# Patient Record
Sex: Male | Born: 1990 | Race: White | Hispanic: No | Marital: Married | Smoking: Never smoker
Health system: Southern US, Community
[De-identification: ages and names within clinical notes are randomized; demographics above are authoritative.]

---
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
## 2011-08-04 ENCOUNTER — Emergency Department (HOSPITAL_COMMUNITY)
Admission: EM | Admit: 2011-08-04 | Discharge: 2011-08-04 | Disposition: A | Discharge status: Home / Self Care | Payer: BC Managed Care – PPO | Attending: Emergency Medicine | Admitting: Emergency Medicine

## 2011-08-04 ENCOUNTER — Emergency Department (HOSPITAL_COMMUNITY): Payer: BC Managed Care – PPO

## 2011-08-04 DIAGNOSIS — W268XXA Contact with other sharp object(s), not elsewhere classified, initial encounter: Secondary | ICD-10-CM | POA: Insufficient documentation

## 2011-08-04 DIAGNOSIS — Y998 Other external cause status: Secondary | ICD-10-CM | POA: Insufficient documentation

## 2011-08-04 DIAGNOSIS — S61209A Unspecified open wound of unspecified finger without damage to nail, initial encounter: Secondary | ICD-10-CM | POA: Insufficient documentation

## 2011-08-04 DIAGNOSIS — S61219A Laceration without foreign body of unspecified finger without damage to nail, initial encounter: Secondary | ICD-10-CM

## 2011-08-04 DIAGNOSIS — Y93G1 Activity, food preparation and clean up: Secondary | ICD-10-CM | POA: Insufficient documentation

## 2011-08-04 NOTE — ED Notes (Signed)
Pt's mother came to the desk, reports pt started to feel nauseous after the PA numbed his fingers.  Went in to check on pt.  Pt denies nausea but reports feeling lightheaded. Then pt states "i feel like im about to pass out."  Pt became pale and diaphoretic.  [facility] EDPA notified.  Pt placed on the pulse ox machine.  Sats 99% RA, HR 38.  Pt stayed conscious, his eyes rolled in the back of his head, states "i can't see."  Pt's family remains at bedside.  Pt remains conscious, instructed to take deep breaths.  After a few minutes, pt came to, pt's color came back, states "i can see now."  Pt's sats 98% RA, HR 42.  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

## 2011-08-04 NOTE — ED Provider Notes (Signed)
History     CSN: [id number]  Arrival date & time 08/04/11  1321   First MD Initiated Contact with Patient 08/04/11 1518      Chief Complaint  Patient presents with  . Extremity Laceration    CUT HIS FINGERS TO LT HAND OPENING A CAN OF SPEG O, BLEEDING CONTROLLED TO POINTER AND MIDDLE FINGER,     (Consider location/radiation/quality/duration/timing/severity/associated sxs/prior treatment) HPI  Patient presents to ER complaining of laceration to left 2nd and 3rd finger tips just PTA when he was attempting to open a ravioli can and slipped lacerating finger tips. Patient states his tetanus is up to date. Patient's mother and father are at bedside. Patient complaining of tingling pain since injury with pain persistent and aggravated by touch. Denies alleviating factors. Denies additional injury. Patient states he has no known medical problems and takes no meds on regular basis.   No past medical history on file.  No past surgical history on file.  No family history on file.  History  Substance Use Topics  . Smoking status: Not on file  . Smokeless tobacco: Not on file  . Alcohol Use: Not on file      Review of Systems  All other systems reviewed and are negative.    Allergies  Review of patient's allergies indicates no known allergies.  Home Medications   Current Outpatient Rx  Name Route Sig Dispense Refill  . IBUPROFEN 200 MG PO TABS Oral Take 400 mg by mouth every 6 (six) hours as needed. pain      BP 145/82  Pulse 72  Temp(Src) 99.2 F (37.3 C) (Oral)  Resp 20  SpO2 99%  Physical Exam  Nursing note and vitals reviewed. Constitutional: He appears well-developed and well-nourished. No distress.  Eyes: Conjunctivae are normal.  Neck: Normal range of motion.  Cardiovascular: Normal rate.   Pulmonary/Chest: Effort normal.  Abdominal: Soft.  Musculoskeletal: Normal range of motion. He exhibits tenderness. He exhibits no edema.       Hands: Neurological: He  is alert.       Normal sensation of distal tips of left fingers with good cap refill.   Skin: Skin is warm and dry. He is not diaphoretic.       No loosening of nail bed but 4mm linear involvement of left free edge of nail of 3rd digit    ED Course  Procedures (including critical care time)  LACERATION REPAIR Performed by: Jenness Corner Authorized by: Jenness Corner Consent: Verbal consent obtained. Risks and benefits: risks, benefits and alternatives were discussed Consent given by: patient Patient identity confirmed: provided demographic data Prepped and Draped in normal sterile fashion Wound explored  Laceration Location: distal tips of left 2nd and 3rd digits  Laceration Length: 2cm  No Foreign Bodies seen or palpated  Anesthesia: digit block of left 2nd and 3rd digits  Local anesthetic: lidocaine 2% without  epinephrine  Anesthetic total: 4 ml in each digit for total of 8mL  Irrigation method: syringe Amount of cleaning: standard, soaked digits in half peroxide/water solution  Skin closure: 4.0 prolene  Number of sutures: 8  Technique: simple interrupted   Patient tolerance: Patient tolerated the procedure well with no immediate complications.   Labs Reviewed - No data to display Dg Hand Complete Left  08/04/2011  *RADIOLOGY REPORT*  Clinical Data: Laceration to index and middle fingers.  LEFT HAND - COMPLETE 3+ VIEW  Comparison: No priors.  Findings: Three views of the left hand demonstrate soft  tissue irregularity overlying the distal aspects of the second and third digits.  No retained radiopaque foreign bodies within the soft tissues.  No definite displaced fractures of the distal phalanges of the second and third digits.  The remainder of the bones of the left hand are otherwise unremarkable in appearance.  IMPRESSION: 1.  Soft tissue trauma to the distal aspect of the left second and third digits, without underlying displaced fracture, or retained radiopaque  foreign body.  Original Report Authenticated By: Florencia Reasons, M.D.     1. Laceration of fingers without complication       MDM  No ligament involvement with laceration of left 2,3rd digits. Good senstation of distal tips. Normal cap refill. No nailbed involvement.         Jenness Corner, [place] 08/04/11 1614

## 2011-08-04 NOTE — ED Notes (Signed)
Pt reports he was attempting to open a can of ravioli-states that he started to use an electric can opener, then a regular can opener and it still would not open so pt reports he started to pull on the can and cut his L index and middle fingers.

## 2011-08-04 NOTE — Discharge Instructions (Signed)
Keep wound and clean with mild soap and water and covered with a topical antibiotic ointment and bandage. Ice and elevate for additional pain relief. Alternate between Ibuprofen and Tylenol for additional pain relief. Follow up with your own medical doctor or the [facility] in approximately 7 days for wound recheck and suture removal. Monitor hand for signs of infection to include but not limited to increasing pain, redness, drainage, or swelling. Return to emergency department for emergent changing or worsening symptoms.   Laceration Care, Adult A laceration is a cut that goes through all layers of the skin. The cut goes into the tissue beneath the skin. HOME CARE For stitches (sutures) or staples:  Keep the cut clean and dry.   If you have a bandage (dressing), change it at least once a day. Change the bandage if it gets wet or dirty, or as told by your doctor.   Wash the cut with soap and water 2 times a day. Rinse the cut with water. Pat it dry with a clean towel.   Put a thin layer of medicated cream on the cut as told by your doctor.   You may shower after the first 24 hours. Do not soak the cut in water until the stitches are removed.   Only take medicines as told by your doctor.   Have your stitches or staples removed as told by your doctor.  For skin adhesive strips:  Keep the cut clean and dry.   Do not get the strips wet. You may take a bath, but be careful to keep the cut dry.   If the cut gets wet, pat it dry with a clean towel.   The strips will fall off on their own. Do not remove the strips that are still stuck to the cut.  For wound glue:  You may shower or take baths. Do not soak or scrub the cut. Do not swim. Avoid heavy sweating until the glue falls off on its own. After a shower or bath, pat the cut dry with a clean towel.   Do not put medicine on your cut until the glue falls off.   If you have a bandage, do not put tape over the glue.    Avoid lots of sunlight or tanning lamps until the glue falls off. Put sunscreen on the cut for the first year to reduce your scar.   The glue will fall off on its own. Do not pick at the glue.  You may need a tetanus shot if:  You cannot remember when you had your last tetanus shot.   You have never had a tetanus shot.  If you need a tetanus shot and you choose not to have one, you may get tetanus. Sickness from tetanus can be serious. GET HELP RIGHT AWAY IF:   Your pain does not get better with medicine.   Your arm, hand, leg, or foot loses feeling (numbness) or changes color.   Your cut is bleeding.   Your joint feels weak, or you cannot use your joint.   You have painful lumps on your body.   Your cut is red, puffy (swollen), or painful.   You have a red line on the skin near the cut.   You have yellowish-white fluid (pus) coming from the cut.   You have a fever.   You have a bad smell coming from the cut or bandage.   Your cut breaks open before or after  stitches are removed.   You notice something coming out of the cut, such as wood or glass.   You cannot move a finger or toe.  MAKE SURE YOU:   Understand these instructions.   Will watch your condition.   Will get help right away if you are not doing well or get worse.  Document Released: 10/27/2007 Document Revised: 04/29/2011 Document Reviewed: 11/03/2010 [facility] Patient Information 2012 [place], [place].

## 2011-08-05 NOTE — ED Provider Notes (Signed)
Medical screening examination/treatment/procedure(s) were performed by non-physician practitioner and as supervising physician I was immediately available for consultation/collaboration.   Laray Anger, DO 08/05/11 1510

## 2012-05-22 ENCOUNTER — Encounter (HOSPITAL_COMMUNITY): Payer: Self-pay | Admitting: *Deleted

## 2012-05-22 ENCOUNTER — Emergency Department (HOSPITAL_COMMUNITY)
Admission: EM | Admit: 2012-05-22 | Discharge: 2012-05-22 | Disposition: A | Discharge status: Home / Self Care | Payer: BC Managed Care – PPO | Attending: Emergency Medicine | Admitting: Emergency Medicine

## 2012-05-22 DIAGNOSIS — F411 Generalized anxiety disorder: Secondary | ICD-10-CM | POA: Insufficient documentation

## 2012-05-22 DIAGNOSIS — I319 Disease of pericardium, unspecified: Secondary | ICD-10-CM

## 2012-05-22 LAB — BASIC METABOLIC PANEL
BUN: 24 mg/dL — ABNORMAL HIGH (ref 6–23)
CO2: 22 mEq/L (ref 19–32)
Chloride: 105 mEq/L (ref 96–112)
Creatinine, Ser: 1.29 mg/dL (ref 0.50–1.35)
GFR calc Af Amer: 90 mL/min — ABNORMAL LOW (ref >90)
Glucose, Bld: 88 mg/dL (ref 70–99)
Potassium: 3.6 mEq/L (ref 3.5–5.1)

## 2012-05-22 LAB — CBC WITH DIFFERENTIAL/PLATELET
Basophils Relative: 0 % (ref 0–1)
HCT: 45.7 % (ref 39.0–52.0)
Hemoglobin: 16 g/dL (ref 13.0–17.0)
Lymphs Abs: 1.6 10*3/uL (ref 0.7–4.0)
MCHC: 35 g/dL (ref 30.0–36.0)
Monocytes Absolute: 0.5 10*3/uL (ref 0.1–1.0)
Monocytes Relative: 7 % (ref 3–12)
Neutro Abs: 5.7 10*3/uL (ref 1.7–7.7)
Neutrophils Relative %: 71 % (ref 43–77)
RBC: 5.34 MIL/uL (ref 4.22–5.81)

## 2012-05-22 MED ORDER — IBUPROFEN 400 MG PO TABS: 400.0000 mg | ORAL_TABLET | Freq: Four times a day (QID) | ORAL | Status: AC | PRN

## 2012-05-22 MED ORDER — KETOROLAC TROMETHAMINE 30 MG/ML IJ SOLN
30.0000 mg | Freq: Once | INTRAMUSCULAR | Status: AC
  Administered 2012-05-22: 30 mg via INTRAVENOUS
  Filled 2012-05-22: qty 1

## 2012-05-22 MED ORDER — SODIUM CHLORIDE 0.9 % IV BOLUS (SEPSIS)
1000.0000 mL | INTRAVENOUS | Status: AC
  Administered 2012-05-22: 1000 mL via INTRAVENOUS

## 2012-05-22 NOTE — ED Notes (Signed)
Pt c/o having an episode lasting about 5 mins tonight of his heart racing when he was walking into a restaurant; denies chest pain or shortness of breath; states had previous episode in Nov when he had to do a presentation at school that lasted about 3 mins; states he feels it is anxiety; denies si or hi

## 2012-05-22 NOTE — ED Provider Notes (Signed)
History     CSN: [id number]  Arrival date & time 05/22/12  4098   First MD Initiated Contact with Patient 05/22/12 1955      No chief complaint on file.   (Consider location/radiation/quality/duration/timing/severity/associated sxs/prior treatment) HPI Comments: This is a 21 year old male, who presents emergency department with chief complaint of racing heart. Patient states that he has had an episode of about 5 minutes tonight when his heart was racing, he also states that he felt somewhat anxious at the time. He denies shortness of breath, nausea, vomiting, diaphoresis, diarrhea, constipation, numbness or tingling of the extremities. States that he does feel some sharp stabbing pains in his chest, which are reproducible with palpation. He's not better or worse lying down or sitting up. He denies any recent illness. Denies any recent travel. Denies recent surgery.  The history is provided by the patient. No language interpreter was used.    History reviewed. No pertinent past medical history.  History reviewed. No pertinent past surgical history.  No family history on file.  History  Substance Use Topics  . Smoking status: Never Smoker   . Smokeless tobacco: Not on file  . Alcohol Use: No      Review of Systems  All other systems reviewed and are negative.    Allergies  Review of patient's allergies indicates no known allergies.  Home Medications   Current Outpatient Rx  Name  Route  Sig  Dispense  Refill  . IBUPROFEN 400 MG PO TABS   Oral   Take 1 tablet (400 mg total) by mouth every 6 (six) hours as needed for pain.   30 tablet   3     BP 126/78  Pulse 78  Temp 98.8 F (37.1 C) (Oral)  Resp 16  Ht 6\' 1"  (1.854 m)  Wt 160 lb (72.576 kg)  BMI 21.11 kg/m2  SpO2 100%  Physical Exam  Nursing note and vitals reviewed. Constitutional: He is oriented to person, place, and time. He appears well-developed and well-nourished.  HENT:  Head: Normocephalic and  atraumatic.  Nose: Nose normal.  Mouth/Throat: Oropharynx is clear and moist. No oropharyngeal exudate.  Eyes: Conjunctivae normal and EOM are normal. Pupils are equal, round, and reactive to light. Right eye exhibits no discharge. Left eye exhibits no discharge. No scleral icterus.  Neck: Normal range of motion. Neck supple. No JVD present.  Cardiovascular: Normal rate, regular rhythm, normal heart sounds and intact distal pulses.  Exam reveals no gallop and no friction rub.   No murmur heard. Pulmonary/Chest: Effort normal and breath sounds normal. No respiratory distress. He has no wheezes. He has no rales. He exhibits no tenderness.       Patient states tenderness to palpation over left chest wall  Abdominal: Soft. Bowel sounds are normal. He exhibits no distension and no mass. There is no tenderness. There is no rebound and no guarding.  Musculoskeletal: Normal range of motion. He exhibits no edema and no tenderness.  Neurological: He is alert and oriented to person, place, and time. He has normal reflexes.       CN 3-12 intact  Skin: Skin is warm and dry.  Psychiatric: He has a normal mood and affect. His behavior is normal. Judgment and thought content normal.    ED Course  Procedures (including critical care time)  Labs Reviewed  BASIC METABOLIC PANEL - Abnormal; Notable for the following:    BUN 24 (*)     GFR calc non Af Denyse Dago  78 (*)     GFR calc Af Amer 90 (*)     All other components within normal limits  CBC WITH DIFFERENTIAL  TROPONIN I  CK   No results found.  ED ECG REPORT  I personally interpreted this EKG   Date: 05/22/2012   Rate: 64  Rhythm: normal sinus rhythm  QRS Axis: normal  Intervals: normal  ST/T Wave abnormalities: ST elevations diffusely  Conduction Disutrbances:none  Narrative Interpretation:   Old EKG Reviewed: none available Results for orders placed during the hospital encounter of 05/22/12  CBC WITH DIFFERENTIAL      Component Value Range     WBC 8.0  4.0 - 10.5 K/uL   RBC 5.34  4.22 - 5.81 MIL/uL   Hemoglobin 16.0  13.0 - 17.0 g/dL   HCT 86.5  78.4 - 69.6 %   MCV 85.6  78.0 - 100.0 fL   MCH 30.0  26.0 - 34.0 pg   MCHC 35.0  30.0 - 36.0 g/dL   RDW 29.5  28.4 - 13.2 %   Platelets 190  150 - 400 K/uL   Neutrophils Relative 71  43 - 77 %   Neutro Abs 5.7  1.7 - 7.7 K/uL   Lymphocytes Relative 20  12 - 46 %   Lymphs Abs 1.6  0.7 - 4.0 K/uL   Monocytes Relative 7  3 - 12 %   Monocytes Absolute 0.5  0.1 - 1.0 K/uL   Eosinophils Relative 2  0 - 5 %   Eosinophils Absolute 0.2  0.0 - 0.7 K/uL   Basophils Relative 0  0 - 1 %   Basophils Absolute 0.0  0.0 - 0.1 K/uL  BASIC METABOLIC PANEL      Component Value Range   Sodium 141  135 - 145 mEq/L   Potassium 3.6  3.5 - 5.1 mEq/L   Chloride 105  96 - 112 mEq/L   CO2 22  19 - 32 mEq/L   Glucose, Bld 88  70 - 99 mg/dL   BUN 24 (*) 6 - 23 mg/dL   Creatinine, Ser 4.40  0.50 - 1.35 mg/dL   Calcium 9.3  8.4 - 10.2 mg/dL   GFR calc non Af Amer 78 (*) >90 mL/min   GFR calc Af Amer 90 (*) >90 mL/min  TROPONIN I      Component Value Range   Troponin I <0.30  <0.30 ng/mL  CK      Component Value Range   Total CK 129  7 - 232 U/L       1. Pericarditis       MDM  This 21 year old male with pericarditis. I discussed this patient with Dr. Jeraldine Loots. Will treat the patient with ibuprofen 400 mg 4 times a day for 3 weeks, unless changed by his primary care provider. Patient understands and agrees with the plan. He is stable and ready for discharge. Return precautions have been given.        Roxy Horseman, PA-C 05/22/12 [phone]

## 2012-05-23 NOTE — ED Provider Notes (Signed)
Medical screening examination/treatment/procedure(s) were performed by non-physician practitioner and as supervising physician I was immediately available for consultation/collaboration.  Gerhard Munch, MD 05/23/12 [phone]

## 2012-10-04 IMAGING — CR DG HAND COMPLETE 3+V*L*
3 series · 3 of 3 positions shown · non-contrast
Comparison: No priors.

CLINICAL DATA: Laceration to index and middle fingers.

LEFT HAND - COMPLETE 3+ VIEW

[x hand pa left]
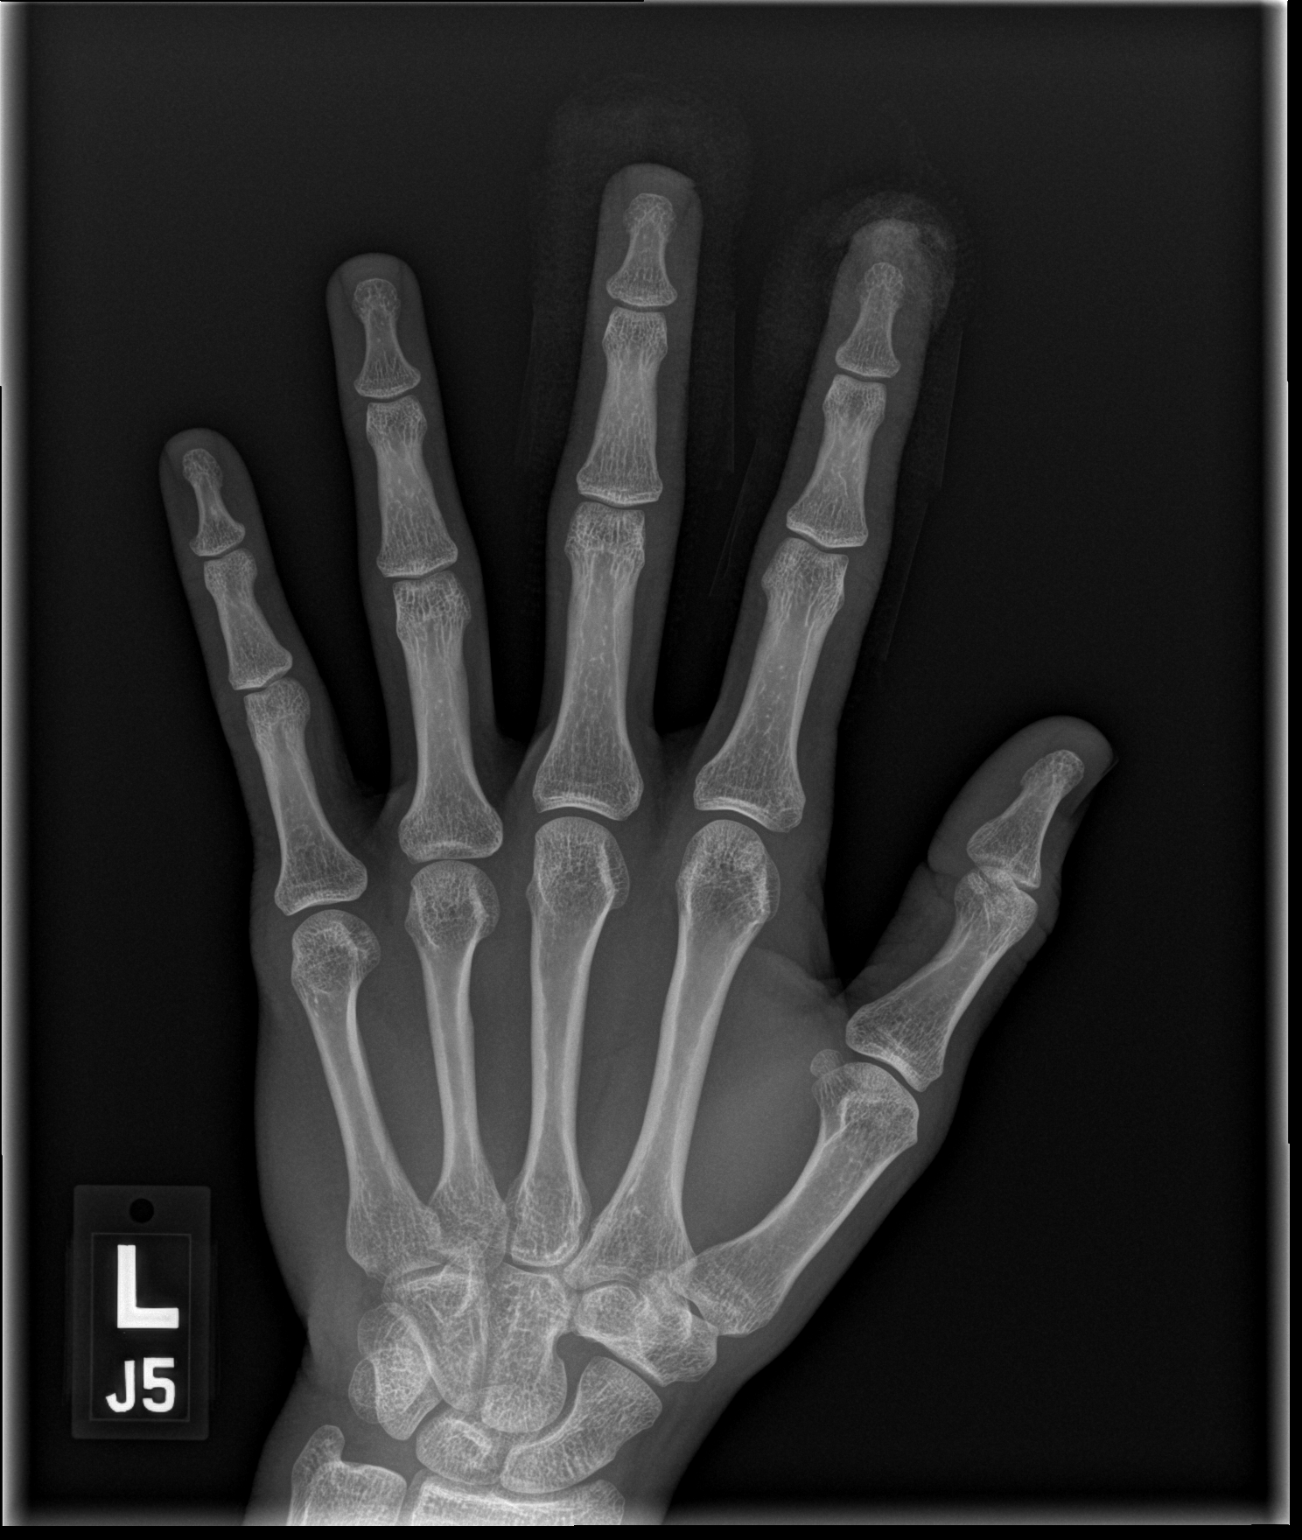

[x hand obl left]
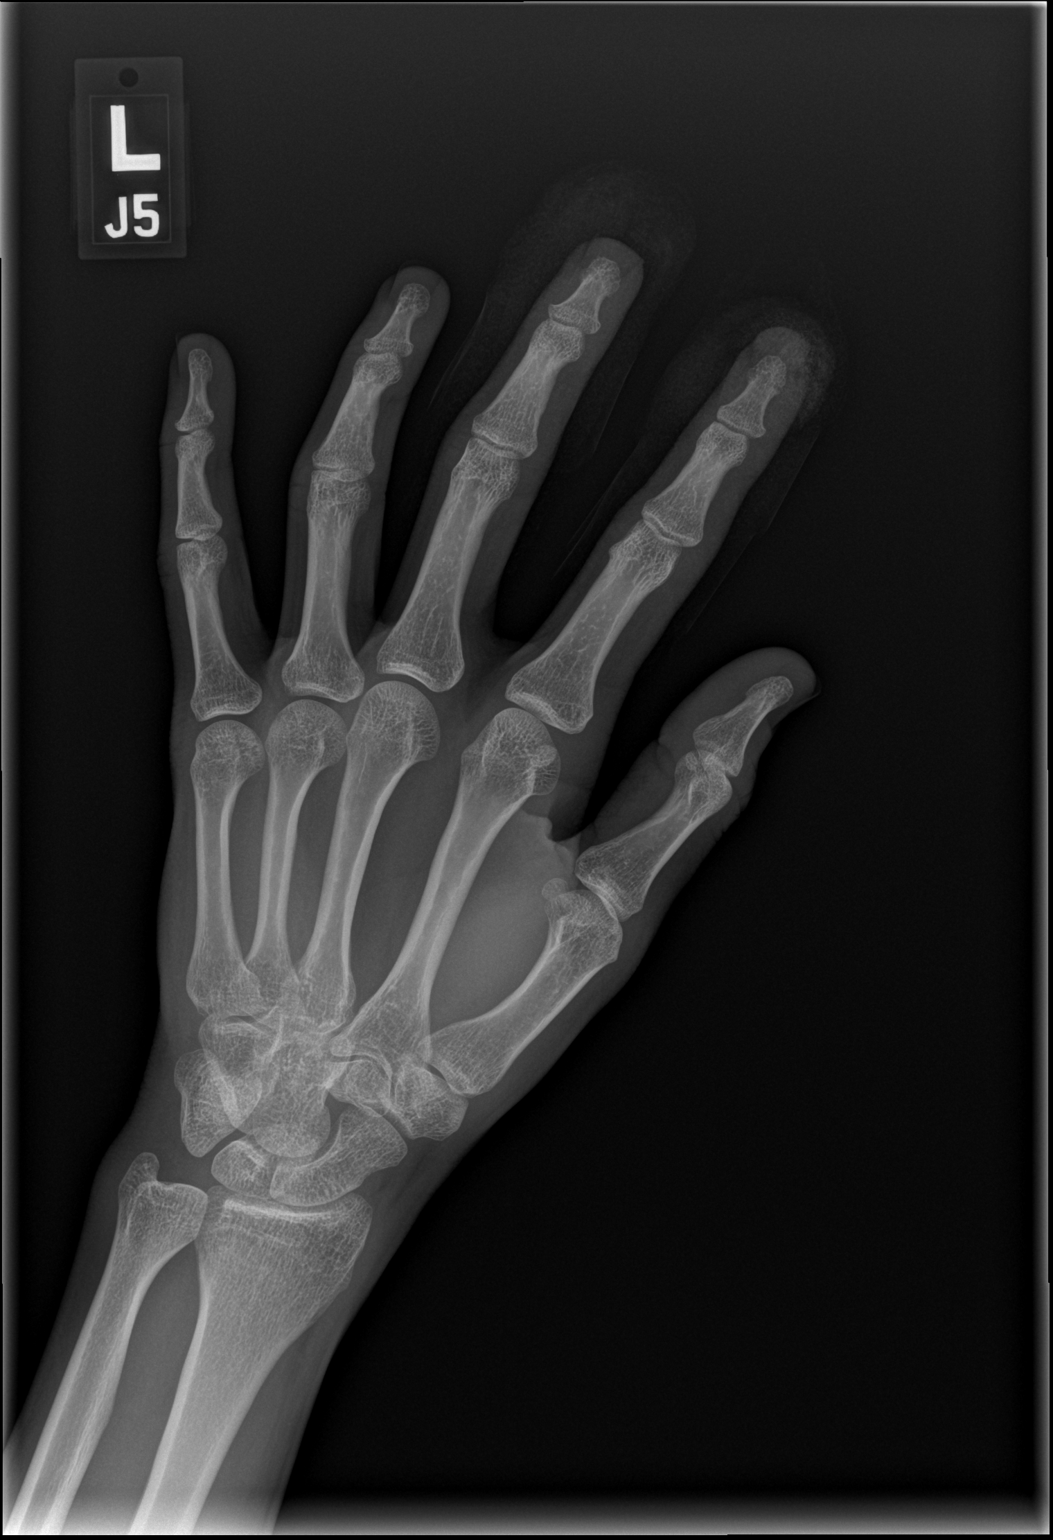

[x hand lat left]
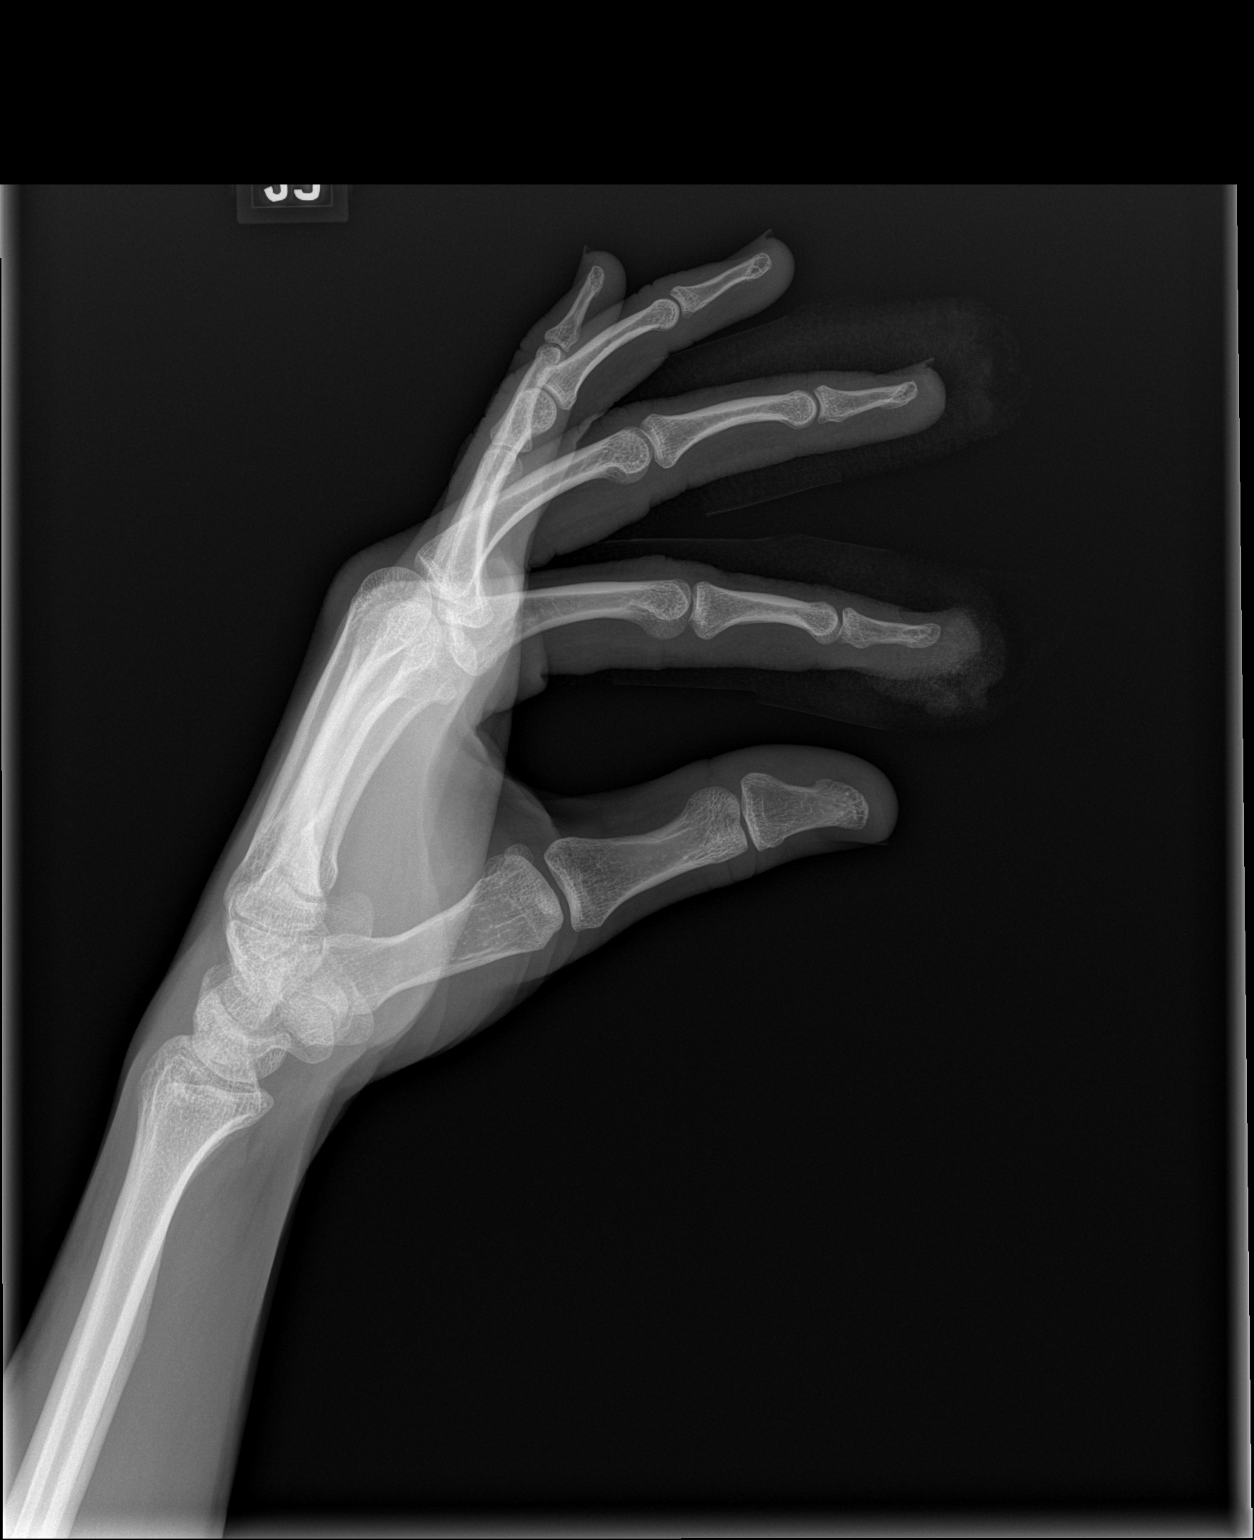

[3 of 3 positions shown; findings below may reference images not displayed]

FINDINGS: Three views of the left hand demonstrate soft tissue
irregularity overlying the distal aspects of the second and third
digits.  No retained radiopaque foreign bodies within the soft
tissues.  No definite displaced fractures of the distal phalanges
of the second and third digits.  The remainder of the bones of the
left hand are otherwise unremarkable in appearance.
IMPRESSION: 1.  Soft tissue trauma to the distal aspect of the left second and
third digits, without underlying displaced fracture, or retained
radiopaque foreign body.

## 2022-02-18 ENCOUNTER — Other Ambulatory Visit (HOSPITAL_BASED_OUTPATIENT_CLINIC_OR_DEPARTMENT_OTHER): Payer: Self-pay

## 2022-02-18 MED ORDER — FLUARIX QUADRIVALENT 0.5 ML IM SUSY
PREFILLED_SYRINGE | INTRAMUSCULAR | 0 refills | Status: AC
  Filled 2022-02-18: qty 0.5, 1d supply, fill #0

## 2022-03-06 ENCOUNTER — Other Ambulatory Visit (HOSPITAL_BASED_OUTPATIENT_CLINIC_OR_DEPARTMENT_OTHER): Payer: Self-pay

## 2022-03-10 ENCOUNTER — Other Ambulatory Visit (HOSPITAL_BASED_OUTPATIENT_CLINIC_OR_DEPARTMENT_OTHER): Payer: Self-pay

## 2022-11-23 ENCOUNTER — Ambulatory Visit (HOSPITAL_COMMUNITY)
Admission: EM | Admit: 2022-11-23 | Discharge: 2022-11-23 | Disposition: A | Discharge status: Home / Self Care | Payer: PRIVATE HEALTH INSURANCE | Attending: Internal Medicine | Admitting: Internal Medicine

## 2022-11-23 ENCOUNTER — Encounter (HOSPITAL_COMMUNITY): Payer: Self-pay

## 2022-11-23 DIAGNOSIS — Z7721 Contact with and (suspected) exposure to potentially hazardous body fluids: Secondary | ICD-10-CM | POA: Diagnosis not present

## 2022-11-23 DIAGNOSIS — Z23 Encounter for immunization: Secondary | ICD-10-CM | POA: Diagnosis not present

## 2022-11-23 LAB — HEPATITIS B SURFACE ANTIBODY,QUALITATIVE: Hep B S Ab: REACTIVE — AB

## 2022-11-23 LAB — HIV ANTIBODY (ROUTINE TESTING W REFLEX): HIV Screen 4th Generation wRfx: NONREACTIVE

## 2022-11-23 MED ORDER — TETANUS-DIPHTH-ACELL PERTUSSIS 5-2.5-18.5 LF-MCG/0.5 IM SUSY
0.5000 mL | PREFILLED_SYRINGE | Freq: Once | INTRAMUSCULAR | Status: AC
  Administered 2022-11-23: 0.5 mL via INTRAMUSCULAR

## 2022-11-23 MED ORDER — TETANUS-DIPHTH-ACELL PERTUSSIS 5-2.5-18.5 LF-MCG/0.5 IM SUSY
PREFILLED_SYRINGE | INTRAMUSCULAR | Status: AC
  Filled 2022-11-23: qty 0.5

## 2022-11-23 NOTE — ED Provider Notes (Signed)
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 He does not know what kid of body fluids could have been on the item. The other employee went to ER and BBB labs were drawn. Pt has had Hep B immunization and had labs done 2 years ago when hired at [employer]. He washed the wound well with soap and water.     History reviewed. No pertinent past medical history.  There are no problems to display for this patient.   History reviewed. No pertinent surgical history.     Home Medications    Prior to Admission medications   Medication Sig Start Date End Date Taking? Authorizing Provider  ibuprofen (ADVIL,MOTRIN) 400 MG tablet Take 1 tablet (400 mg total) by mouth every 6 (six) hours as needed for pain. 05/22/12   Roxy Horseman, PA-C  influenza vac split quadrivalent PF (FLUARIX QUADRIVALENT) 0.5 ML injection Inject into the muscle. 02/18/22       Family History Family History  Problem Relation Age of Onset   Cancer - Lung Paternal Grandmother    Heart attack Paternal Grandfather     Social History Social History   Tobacco Use   Smoking status: Never  Substance Use Topics   Alcohol use: No     Allergies   Patient has no known allergies.   Review of Systems Review of Systems  Skin:  Positive for wound.     Physical Exam Triage Vital Signs ED Triage Vitals  Enc Vitals Group     BP 11/23/22 1758 135/83     Pulse Rate 11/23/22 1757 61     Resp 11/23/22 1757 17     Temp 11/23/22 1757 98.3 F (36.8 C)     Temp Source 11/23/22 1757 Oral     SpO2 11/23/22 1757 98 %     Weight --       Height --      Head Circumference --      Peak Flow --      Pain Score 11/23/22 1756 0     Pain Loc --      Pain Edu? --      Excl. in GC? --    No data found.  Updated Vital Signs BP 135/83   Pulse 61   Temp 98.3 F (36.8 C) (Oral)   Resp 17   SpO2 98%   Visual Acuity Right Eye Distance:   Left Eye Distance:   Bilateral Distance:    Right Eye Near:   Left Eye Near:    Bilateral Near:     Physical Exam Vitals and nursing note reviewed.  Constitutional:      General: He is not in acute distress.    Appearance: He is normal weight. He is not toxic-appearing.  HENT:     Right Ear: External ear normal.     Left Ear: External ear normal.  Eyes:     General: No scleral icterus.    Conjunctiva/sclera: Conjunctivae normal.  Pulmonary:     Effort: Pulmonary effort is  normal.  Skin:    Comments: R WRIST- volar aspect of wrist has superficial linear abrasion which is clean and not bleeding and seems to be around 3 cm  Neurological:     Mental Status: He is alert and oriented to person, place, and time.     Gait: Gait normal.  Psychiatric:        Mood and Affect: Mood normal.        Behavior: Behavior normal.        Thought Content: Thought content normal.        Judgment: Judgment normal.      UC Treatments / Results  Labs (all labs ordered are listed, but only abnormal results are displayed) Labs Reviewed  HIV ANTIBODY (ROUTINE TESTING W REFLEX)  HCV AB W REFLEX TO QUANT PCR  HEPATITIS B SURFACE ANTIBODY,QUALITATIVE    EKG   Radiology No results found.  Procedures Procedures (including critical care time)  Medications Ordered in UC Medications  Tdap (BOOSTRIX) injection 0.5 mL (0.5 mLs Intramuscular Given 11/23/22 1817)    Initial Impression / Assessment and Plan / UC Course  I have reviewed the triage vital signs and the nursing notes.  TDAP given  I ordered BBB routine labs and needs to FU in 3 days with [facility].     Final  Clinical Impressions(s) / UC Diagnoses   Final diagnoses:  Exposure to blood-borne pathogen     Discharge Instructions      Follow up with [facility] in 3 days to go over your results and for future follow ups [address] [place], [place]  [phone]     ED Prescriptions   None    PDMP not reviewed this encounter.   Garey Ham, [place] 11/23/22 1831

## 2022-11-23 NOTE — Discharge Instructions (Signed)
Follow up with [facility] in 3 days to go over your results and for future follow ups [address] [place], [place]  [phone]

## 2022-11-23 NOTE — ED Triage Notes (Signed)
Pt presents with a lac to the rt wrist.   Pt states he cut himself with a drain snake, states he has not had a tetanus shot in a while.

## 2022-11-25 LAB — HCV AB W REFLEX TO QUANT PCR: HCV Ab: NONREACTIVE

## 2022-11-25 LAB — HCV INTERPRETATION

## 2023-02-08 ENCOUNTER — Other Ambulatory Visit (HOSPITAL_BASED_OUTPATIENT_CLINIC_OR_DEPARTMENT_OTHER): Payer: Self-pay

## 2023-02-08 MED ORDER — FLULAVAL 0.5 ML IM SUSY
0.5000 mL | PREFILLED_SYRINGE | Freq: Once | INTRAMUSCULAR | 0 refills | Status: AC
  Filled 2023-02-08: qty 0.5, 1d supply, fill #0

## 2024-02-07 ENCOUNTER — Other Ambulatory Visit (HOSPITAL_BASED_OUTPATIENT_CLINIC_OR_DEPARTMENT_OTHER): Payer: Self-pay

## 2024-02-07 MED ORDER — FLUZONE 0.5 ML IM SUSY
0.5000 mL | PREFILLED_SYRINGE | Freq: Once | INTRAMUSCULAR | 0 refills | Status: AC
  Filled 2024-02-15: qty 0.5, 1d supply, fill #0

## 2024-02-13 ENCOUNTER — Other Ambulatory Visit (HOSPITAL_BASED_OUTPATIENT_CLINIC_OR_DEPARTMENT_OTHER): Payer: Self-pay

## 2024-02-15 ENCOUNTER — Other Ambulatory Visit (HOSPITAL_BASED_OUTPATIENT_CLINIC_OR_DEPARTMENT_OTHER): Payer: Self-pay
